# Patient Record
Sex: Female | Born: 1955 | Race: White | Hispanic: No | Marital: Single | Smoking: Current every day smoker
Health system: Southern US, Community
[De-identification: ages and names within clinical notes are randomized; demographics above are authoritative.]

## PROBLEM LIST (undated history)

## (undated) DIAGNOSIS — F410 Panic disorder [episodic paroxysmal anxiety] without agoraphobia: Secondary | ICD-10-CM

## (undated) DIAGNOSIS — I10 Essential (primary) hypertension: Secondary | ICD-10-CM

## (undated) DIAGNOSIS — E7800 Pure hypercholesterolemia, unspecified: Secondary | ICD-10-CM

## (undated) HISTORY — DX: Panic disorder (episodic paroxysmal anxiety): F41.0

## (undated) HISTORY — PX: TUBAL LIGATION: SHX77

## (undated) HISTORY — PX: BREAST ENHANCEMENT SURGERY: SHX7

## (undated) HISTORY — DX: Pure hypercholesterolemia, unspecified: E78.00

## (undated) HISTORY — DX: Essential (primary) hypertension: I10

---

## 2000-10-02 ENCOUNTER — Other Ambulatory Visit: Admission: RE | Admit: 2000-10-02 | Discharge: 2000-10-02 | Payer: Self-pay | Admitting: Obstetrics and Gynecology

## 2001-11-12 ENCOUNTER — Other Ambulatory Visit: Admission: RE | Admit: 2001-11-12 | Discharge: 2001-11-12 | Payer: Self-pay | Admitting: Obstetrics and Gynecology

## 2002-11-02 ENCOUNTER — Encounter: Admission: RE | Admit: 2002-11-02 | Discharge: 2002-11-02 | Payer: Self-pay | Admitting: Family Medicine

## 2002-11-02 ENCOUNTER — Encounter (INDEPENDENT_AMBULATORY_CARE_PROVIDER_SITE_OTHER): Payer: Self-pay | Admitting: *Deleted

## 2002-11-18 ENCOUNTER — Other Ambulatory Visit: Admission: RE | Admit: 2002-11-18 | Discharge: 2002-11-18 | Payer: Self-pay | Admitting: Family Medicine

## 2002-11-18 ENCOUNTER — Encounter: Admission: RE | Admit: 2002-11-18 | Discharge: 2002-11-18 | Payer: Self-pay | Admitting: Family Medicine

## 2002-11-23 ENCOUNTER — Encounter: Admission: RE | Admit: 2002-11-23 | Discharge: 2002-11-23 | Payer: Self-pay | Admitting: Family Medicine

## 2002-12-08 ENCOUNTER — Encounter: Admission: RE | Admit: 2002-12-08 | Discharge: 2002-12-08 | Payer: Self-pay | Admitting: Sports Medicine

## 2003-05-14 ENCOUNTER — Encounter: Admission: RE | Admit: 2003-05-14 | Discharge: 2003-05-14 | Payer: Self-pay | Admitting: Family Medicine

## 2003-05-14 ENCOUNTER — Encounter (INDEPENDENT_AMBULATORY_CARE_PROVIDER_SITE_OTHER): Payer: Self-pay | Admitting: *Deleted

## 2003-06-11 ENCOUNTER — Encounter: Admission: RE | Admit: 2003-06-11 | Discharge: 2003-06-11 | Payer: Self-pay | Admitting: Family Medicine

## 2003-08-16 ENCOUNTER — Encounter (INDEPENDENT_AMBULATORY_CARE_PROVIDER_SITE_OTHER): Payer: Self-pay | Admitting: *Deleted

## 2003-08-16 LAB — CONVERTED CEMR LAB

## 2003-09-14 ENCOUNTER — Encounter: Admission: RE | Admit: 2003-09-14 | Discharge: 2003-09-14 | Payer: Self-pay | Admitting: Family Medicine

## 2003-09-14 ENCOUNTER — Encounter (INDEPENDENT_AMBULATORY_CARE_PROVIDER_SITE_OTHER): Payer: Self-pay | Admitting: *Deleted

## 2003-09-29 ENCOUNTER — Ambulatory Visit: Payer: Self-pay | Admitting: Family Medicine

## 2003-12-21 ENCOUNTER — Ambulatory Visit: Payer: Self-pay | Admitting: Sports Medicine

## 2004-03-22 ENCOUNTER — Ambulatory Visit: Payer: Self-pay | Admitting: Family Medicine

## 2005-03-12 ENCOUNTER — Ambulatory Visit: Payer: Self-pay | Admitting: Family Medicine

## 2006-03-14 DIAGNOSIS — I10 Essential (primary) hypertension: Secondary | ICD-10-CM | POA: Insufficient documentation

## 2006-03-14 DIAGNOSIS — M542 Cervicalgia: Secondary | ICD-10-CM | POA: Insufficient documentation

## 2006-03-14 DIAGNOSIS — F17200 Nicotine dependence, unspecified, uncomplicated: Secondary | ICD-10-CM | POA: Insufficient documentation

## 2006-03-14 DIAGNOSIS — E7800 Pure hypercholesterolemia, unspecified: Secondary | ICD-10-CM | POA: Insufficient documentation

## 2006-03-14 DIAGNOSIS — F410 Panic disorder [episodic paroxysmal anxiety] without agoraphobia: Secondary | ICD-10-CM | POA: Insufficient documentation

## 2006-03-15 ENCOUNTER — Encounter (INDEPENDENT_AMBULATORY_CARE_PROVIDER_SITE_OTHER): Payer: Self-pay | Admitting: *Deleted

## 2006-05-29 ENCOUNTER — Ambulatory Visit: Payer: Self-pay | Admitting: Internal Medicine

## 2006-05-29 LAB — CONVERTED CEMR LAB
BUN: 16 mg/dL (ref 6–23)
Basophils Absolute: 0 10*3/uL (ref 0.0–0.1)
Basophils Relative: 0 % (ref 0.0–1.0)
CO2: 30 meq/L (ref 19–32)
Calcium: 9.5 mg/dL (ref 8.4–10.5)
Chloride: 105 meq/L (ref 96–112)
Creatinine, Ser: 0.8 mg/dL (ref 0.4–1.2)
Eosinophils Absolute: 0.1 10*3/uL (ref 0.0–0.6)
Eosinophils Relative: 1.9 % (ref 0.0–5.0)
GFR calc Af Amer: 98 mL/min
GFR calc non Af Amer: 81 mL/min
Glucose, Bld: 94 mg/dL (ref 70–99)
HCT: 40.8 % (ref 36.0–46.0)
Hemoglobin: 14 g/dL (ref 12.0–15.0)
Lymphocytes Relative: 39.3 % (ref 12.0–46.0)
MCHC: 34.4 g/dL (ref 30.0–36.0)
MCV: 95 fL (ref 78.0–100.0)
Monocytes Absolute: 0.4 10*3/uL (ref 0.2–0.7)
Monocytes Relative: 6.3 % (ref 3.0–11.0)
Neutro Abs: 3.8 10*3/uL (ref 1.4–7.7)
Neutrophils Relative %: 52.5 % (ref 43.0–77.0)
Platelets: 273 10*3/uL (ref 150–400)
Potassium: 4.2 meq/L (ref 3.5–5.1)
RBC: 4.29 M/uL (ref 3.87–5.11)
RDW: 13.7 % (ref 11.5–14.6)
Sed Rate: 11 mm/hr (ref 0–25)
Sodium: 140 meq/L (ref 135–145)
TSH: 1.52 microintl units/mL (ref 0.35–5.50)
WBC: 7.1 10*3/uL (ref 4.5–10.5)

## 2006-06-07 ENCOUNTER — Telehealth: Payer: Self-pay | Admitting: Internal Medicine

## 2006-06-11 ENCOUNTER — Encounter: Payer: Self-pay | Admitting: Internal Medicine

## 2006-06-11 ENCOUNTER — Ambulatory Visit: Payer: Self-pay | Admitting: Internal Medicine

## 2006-06-11 DIAGNOSIS — R51 Headache: Secondary | ICD-10-CM | POA: Insufficient documentation

## 2006-06-11 DIAGNOSIS — R079 Chest pain, unspecified: Secondary | ICD-10-CM | POA: Insufficient documentation

## 2006-06-11 DIAGNOSIS — R519 Headache, unspecified: Secondary | ICD-10-CM | POA: Insufficient documentation

## 2006-10-15 ENCOUNTER — Encounter: Admission: RE | Admit: 2006-10-15 | Discharge: 2006-10-15 | Payer: Self-pay | Admitting: Obstetrics and Gynecology

## 2008-03-08 ENCOUNTER — Ambulatory Visit: Payer: Self-pay | Admitting: Diagnostic Radiology

## 2008-03-08 ENCOUNTER — Emergency Department (HOSPITAL_BASED_OUTPATIENT_CLINIC_OR_DEPARTMENT_OTHER): Admission: EM | Admit: 2008-03-08 | Discharge: 2008-03-08 | Payer: Self-pay | Admitting: Emergency Medicine

## 2010-02-05 ENCOUNTER — Encounter: Payer: Self-pay | Admitting: Obstetrics and Gynecology

## 2010-05-02 LAB — POCT CARDIAC MARKERS
CKMB, poc: 1.3 ng/mL (ref 1.0–8.0)
CKMB, poc: 1.4 ng/mL (ref 1.0–8.0)
Myoglobin, poc: 45.4 ng/mL (ref 12–200)
Myoglobin, poc: 78.8 ng/mL (ref 12–200)
Troponin i, poc: <0.05 ng/mL (ref 0.00–0.09)
Troponin i, poc: <0.05 ng/mL (ref 0.00–0.09)

## 2010-05-02 LAB — DIFFERENTIAL
Basophils Absolute: 0.1 10*3/uL (ref 0.0–0.1)
Basophils Relative: 1 % (ref 0–1)
Eosinophils Absolute: 0.2 10*3/uL (ref 0.0–0.7)
Eosinophils Relative: 2 % (ref 0–5)
Lymphocytes Relative: 43 % (ref 12–46)
Lymphs Abs: 4.1 10*3/uL — ABNORMAL HIGH (ref 0.7–4.0)
Monocytes Absolute: 0.6 10*3/uL (ref 0.1–1.0)
Monocytes Relative: 7 % (ref 3–12)
Neutro Abs: 4.5 10*3/uL (ref 1.7–7.7)
Neutrophils Relative %: 48 % (ref 43–77)

## 2010-05-02 LAB — BASIC METABOLIC PANEL
BUN: 12 mg/dL (ref 6–23)
CO2: 28 mEq/L (ref 19–32)
Calcium: 9.6 mg/dL (ref 8.4–10.5)
Chloride: 106 mEq/L (ref 96–112)
Creatinine, Ser: 0.7 mg/dL (ref 0.4–1.2)
GFR calc Af Amer: >60 mL/min (ref >60)
GFR calc non Af Amer: >60 mL/min (ref >60)
Glucose, Bld: 84 mg/dL (ref 70–99)
Potassium: 3.5 mEq/L (ref 3.5–5.1)
Sodium: 142 mEq/L (ref 135–145)

## 2010-05-02 LAB — CBC
HCT: 43.1 % (ref 36.0–46.0)
Hemoglobin: 14.4 g/dL (ref 12.0–15.0)
MCHC: 33.4 g/dL (ref 30.0–36.0)
MCV: 91.7 fL (ref 78.0–100.0)
Platelets: 334 10*3/uL (ref 150–400)
RBC: 4.7 MIL/uL (ref 3.87–5.11)
RDW: 13.3 % (ref 11.5–15.5)
WBC: 9.5 10*3/uL (ref 4.0–10.5)

## 2010-12-02 IMAGING — CR DG CHEST 2V
2 series · 2 of 2 positions shown · non-contrast
Comparison: None

CLINICAL DATA: Chest pain.  Hypertension.  Smoking history.

CHEST - 2 VIEW

[w chest pa]
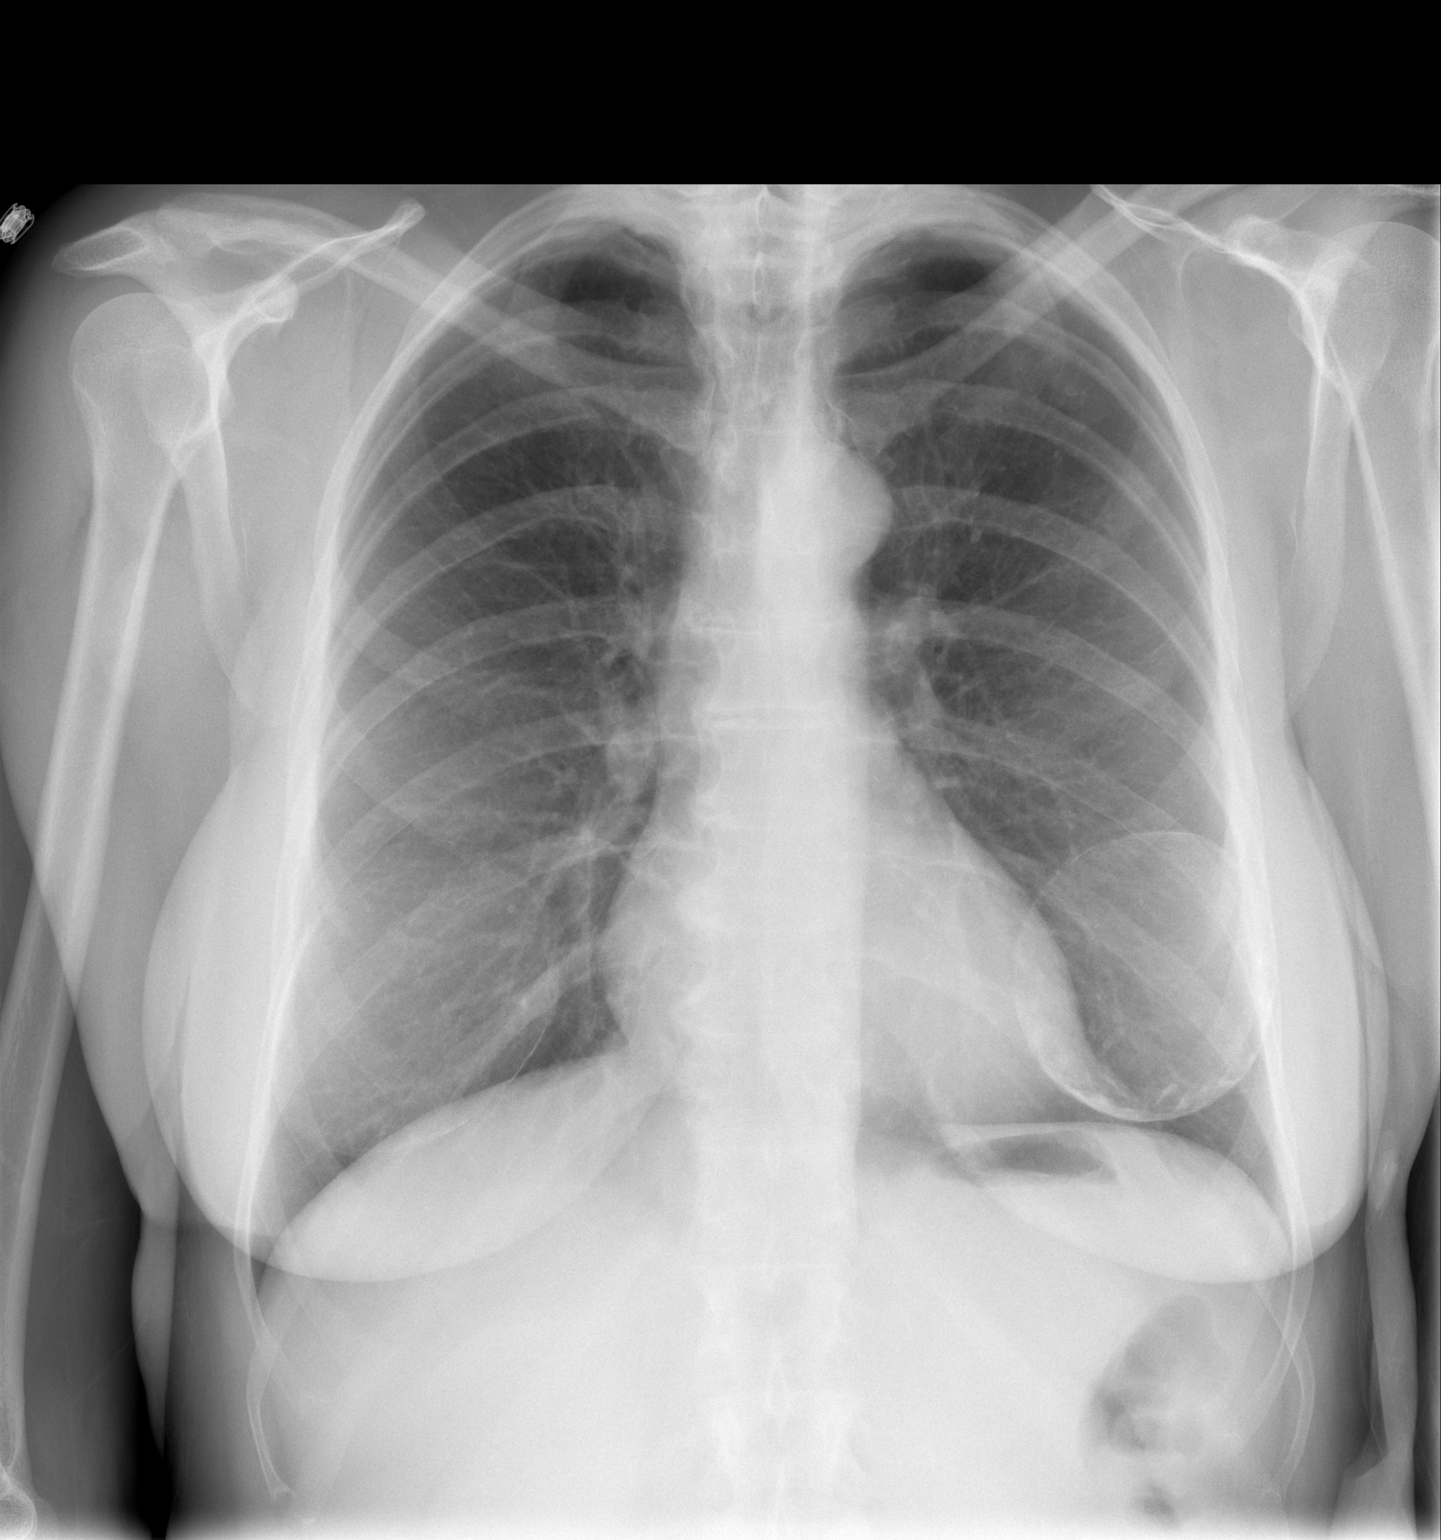

[w chest lat]
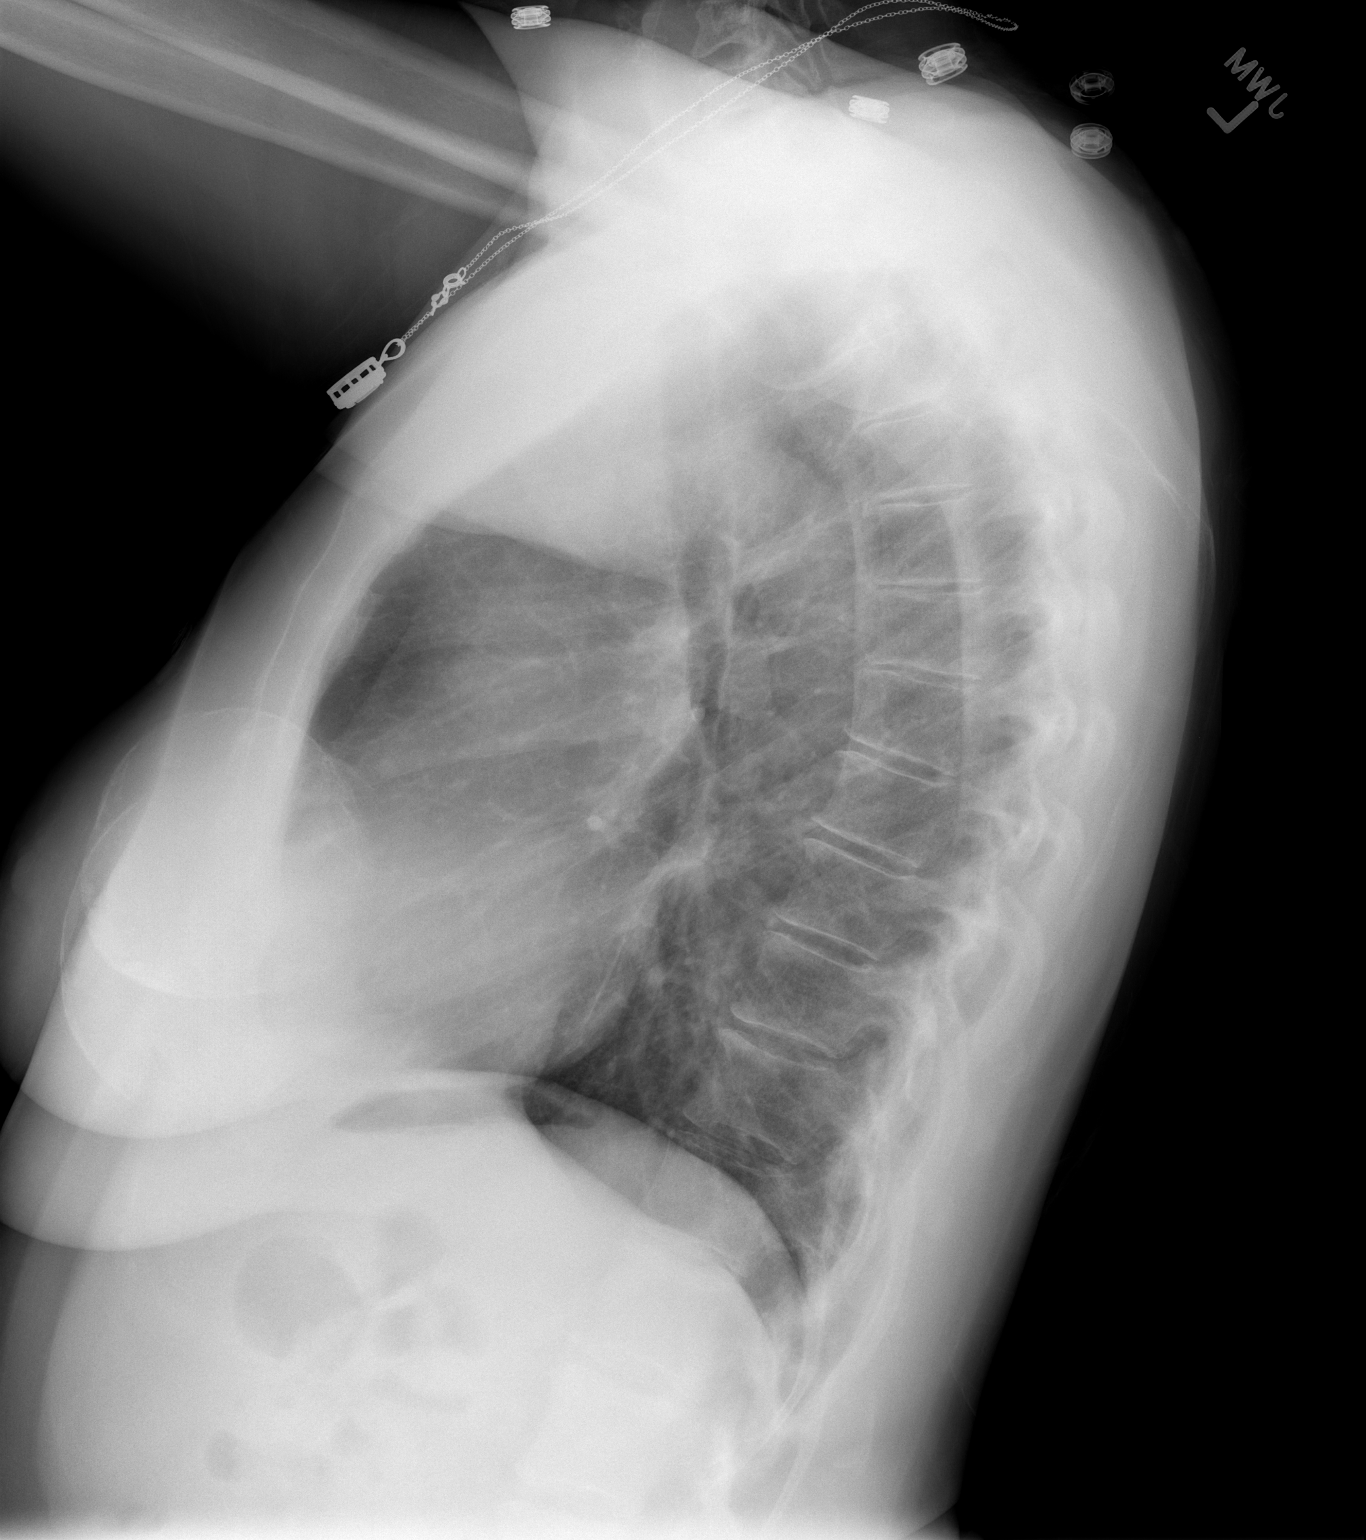

[2 of 2 positions shown; findings below may reference images not displayed]

FINDINGS: Heart size is normal.  The mediastinum is unremarkable.
Lungs are clear.  Vascularity is normal.  No effusions.  No
significant bony finding.  Breast implants present bilaterally.
IMPRESSION: No active cardiopulmonary disease.

## 2011-05-29 ENCOUNTER — Encounter: Payer: Self-pay | Admitting: *Deleted

## 2011-10-02 ENCOUNTER — Encounter: Payer: Self-pay | Admitting: Cardiology

## 2011-11-12 ENCOUNTER — Other Ambulatory Visit: Payer: Self-pay | Admitting: Obstetrics and Gynecology

## 2014-11-16 ENCOUNTER — Other Ambulatory Visit: Payer: Self-pay | Admitting: Obstetrics and Gynecology

## 2014-11-17 LAB — CYTOLOGY - PAP

## 2015-12-15 ENCOUNTER — Other Ambulatory Visit: Payer: Self-pay | Admitting: Obstetrics and Gynecology

## 2015-12-16 LAB — CYTOLOGY - PAP

## 2016-04-03 DIAGNOSIS — R7301 Impaired fasting glucose: Secondary | ICD-10-CM | POA: Diagnosis not present

## 2016-04-03 DIAGNOSIS — R002 Palpitations: Secondary | ICD-10-CM | POA: Diagnosis not present

## 2016-04-03 DIAGNOSIS — E785 Hyperlipidemia, unspecified: Secondary | ICD-10-CM | POA: Diagnosis not present

## 2016-04-03 DIAGNOSIS — I10 Essential (primary) hypertension: Secondary | ICD-10-CM | POA: Diagnosis not present

## 2016-06-15 DIAGNOSIS — H6123 Impacted cerumen, bilateral: Secondary | ICD-10-CM | POA: Diagnosis not present

## 2017-01-24 DIAGNOSIS — I10 Essential (primary) hypertension: Secondary | ICD-10-CM | POA: Diagnosis not present

## 2017-01-24 DIAGNOSIS — R7301 Impaired fasting glucose: Secondary | ICD-10-CM | POA: Diagnosis not present

## 2017-01-24 DIAGNOSIS — R002 Palpitations: Secondary | ICD-10-CM | POA: Diagnosis not present

## 2017-03-22 DIAGNOSIS — R0789 Other chest pain: Secondary | ICD-10-CM | POA: Diagnosis not present

## 2017-03-22 DIAGNOSIS — I10 Essential (primary) hypertension: Secondary | ICD-10-CM | POA: Diagnosis not present

## 2017-04-11 DIAGNOSIS — E7800 Pure hypercholesterolemia, unspecified: Secondary | ICD-10-CM | POA: Diagnosis not present

## 2017-04-11 DIAGNOSIS — R42 Dizziness and giddiness: Secondary | ICD-10-CM | POA: Diagnosis not present

## 2017-04-11 DIAGNOSIS — J069 Acute upper respiratory infection, unspecified: Secondary | ICD-10-CM | POA: Diagnosis not present

## 2017-04-11 DIAGNOSIS — R7301 Impaired fasting glucose: Secondary | ICD-10-CM | POA: Diagnosis not present

## 2017-04-15 DIAGNOSIS — J0100 Acute maxillary sinusitis, unspecified: Secondary | ICD-10-CM | POA: Diagnosis not present

## 2017-08-13 DIAGNOSIS — I10 Essential (primary) hypertension: Secondary | ICD-10-CM | POA: Diagnosis not present

## 2017-08-13 DIAGNOSIS — R7301 Impaired fasting glucose: Secondary | ICD-10-CM | POA: Diagnosis not present

## 2017-08-13 DIAGNOSIS — E7800 Pure hypercholesterolemia, unspecified: Secondary | ICD-10-CM | POA: Diagnosis not present

## 2017-08-15 DIAGNOSIS — E7800 Pure hypercholesterolemia, unspecified: Secondary | ICD-10-CM | POA: Diagnosis not present

## 2018-02-25 DIAGNOSIS — E7800 Pure hypercholesterolemia, unspecified: Secondary | ICD-10-CM | POA: Diagnosis not present

## 2018-02-25 DIAGNOSIS — I10 Essential (primary) hypertension: Secondary | ICD-10-CM | POA: Diagnosis not present

## 2018-02-25 DIAGNOSIS — R7303 Prediabetes: Secondary | ICD-10-CM | POA: Diagnosis not present

## 2018-12-19 ENCOUNTER — Other Ambulatory Visit: Payer: Self-pay | Admitting: Family Medicine

## 2018-12-19 DIAGNOSIS — Z1231 Encounter for screening mammogram for malignant neoplasm of breast: Secondary | ICD-10-CM

## 2019-04-11 ENCOUNTER — Ambulatory Visit: Payer: Self-pay | Attending: Internal Medicine

## 2019-04-11 DIAGNOSIS — Z23 Encounter for immunization: Secondary | ICD-10-CM

## 2019-04-11 NOTE — Progress Notes (Signed)
   Covid-[facility] Vaccination [facility]  Name:  Donna Compton    MRN: [id number] DOB: 04/16/1955  04/11/2019  Donna Compton was observed post Covid-19 immunization for 15 minutes without incident. She was provided with Vaccine Information Sheet and instruction to access the V-Safe system.   Donna Compton was instructed to call 911 with any severe reactions post vaccine: Marland Kitchen Difficulty breathing  . Swelling of face and throat  . A fast heartbeat  . A bad rash all over body  . Dizziness and weakness   Immunizations Administered    Name Date Dose VIS Date Route   Pfizer COVID-19 Vaccine 04/11/2019  9:22 AM 0.3 mL 12/26/2018 Intramuscular   Manufacturer: [employer], [employer]   Lot: [id number]   NDC: [address]-1

## 2019-05-06 ENCOUNTER — Ambulatory Visit: Payer: Self-pay | Attending: Internal Medicine

## 2019-05-06 DIAGNOSIS — Z23 Encounter for immunization: Secondary | ICD-10-CM

## 2019-05-06 NOTE — Progress Notes (Signed)
   Covid-19 Vaccination Clinic  Name:  Donna Compton    MRN: [id number] DOB: 1955/02/23  05/06/2019  Donna Compton was observed post Covid-19 immunization for 15 minutes without incident. She was provided with Vaccine Information Sheet and instruction to access the V-Safe system.   Donna Compton was instructed to call 911 with any severe reactions post vaccine: Marland Kitchen Difficulty breathing  . Swelling of face and throat  . A fast heartbeat  . A bad rash all over body  . Dizziness and weakness   Immunizations Administered    Name Date Dose VIS Date Route   Pfizer COVID-19 Vaccine 05/06/2019  4:22 PM 0.3 mL 03/11/2018 Intramuscular   Manufacturer: [employer], [employer]   Lot: [id number]   NDC: [address]-1

## 2021-10-03 DIAGNOSIS — F419 Anxiety disorder, unspecified: Secondary | ICD-10-CM | POA: Diagnosis not present

## 2021-10-03 DIAGNOSIS — G479 Sleep disorder, unspecified: Secondary | ICD-10-CM | POA: Diagnosis not present

## 2021-10-03 DIAGNOSIS — R7303 Prediabetes: Secondary | ICD-10-CM | POA: Diagnosis not present

## 2021-10-03 DIAGNOSIS — I10 Essential (primary) hypertension: Secondary | ICD-10-CM | POA: Diagnosis not present

## 2021-10-20 DIAGNOSIS — Z1211 Encounter for screening for malignant neoplasm of colon: Secondary | ICD-10-CM | POA: Diagnosis not present

## 2021-10-20 DIAGNOSIS — Z0000 Encounter for general adult medical examination without abnormal findings: Secondary | ICD-10-CM | POA: Diagnosis not present

## 2021-10-20 DIAGNOSIS — E7800 Pure hypercholesterolemia, unspecified: Secondary | ICD-10-CM | POA: Diagnosis not present

## 2021-10-20 DIAGNOSIS — E559 Vitamin D deficiency, unspecified: Secondary | ICD-10-CM | POA: Diagnosis not present

## 2021-10-20 DIAGNOSIS — R7303 Prediabetes: Secondary | ICD-10-CM | POA: Diagnosis not present

## 2021-10-20 DIAGNOSIS — I10 Essential (primary) hypertension: Secondary | ICD-10-CM | POA: Diagnosis not present

## 2022-04-26 DIAGNOSIS — I10 Essential (primary) hypertension: Secondary | ICD-10-CM | POA: Diagnosis not present

## 2022-04-26 DIAGNOSIS — R7303 Prediabetes: Secondary | ICD-10-CM | POA: Diagnosis not present

## 2022-04-26 DIAGNOSIS — E7800 Pure hypercholesterolemia, unspecified: Secondary | ICD-10-CM | POA: Diagnosis not present

## 2022-04-26 DIAGNOSIS — K30 Functional dyspepsia: Secondary | ICD-10-CM | POA: Diagnosis not present

## 2022-04-30 DIAGNOSIS — Z129 Encounter for screening for malignant neoplasm, site unspecified: Secondary | ICD-10-CM | POA: Diagnosis not present

## 2022-04-30 DIAGNOSIS — L308 Other specified dermatitis: Secondary | ICD-10-CM | POA: Diagnosis not present

## 2022-04-30 DIAGNOSIS — D485 Neoplasm of uncertain behavior of skin: Secondary | ICD-10-CM | POA: Diagnosis not present

## 2022-04-30 DIAGNOSIS — D0461 Carcinoma in situ of skin of right upper limb, including shoulder: Secondary | ICD-10-CM | POA: Diagnosis not present

## 2022-04-30 DIAGNOSIS — L821 Other seborrheic keratosis: Secondary | ICD-10-CM | POA: Diagnosis not present

## 2022-04-30 DIAGNOSIS — L820 Inflamed seborrheic keratosis: Secondary | ICD-10-CM | POA: Diagnosis not present

## 2022-04-30 DIAGNOSIS — L570 Actinic keratosis: Secondary | ICD-10-CM | POA: Diagnosis not present

## 2022-04-30 DIAGNOSIS — D225 Melanocytic nevi of trunk: Secondary | ICD-10-CM | POA: Diagnosis not present

## 2022-06-05 DIAGNOSIS — D0461 Carcinoma in situ of skin of right upper limb, including shoulder: Secondary | ICD-10-CM | POA: Diagnosis not present

## 2022-06-05 DIAGNOSIS — L570 Actinic keratosis: Secondary | ICD-10-CM | POA: Diagnosis not present

## 2022-08-06 DIAGNOSIS — L820 Inflamed seborrheic keratosis: Secondary | ICD-10-CM | POA: Diagnosis not present

## 2022-08-06 DIAGNOSIS — L570 Actinic keratosis: Secondary | ICD-10-CM | POA: Diagnosis not present

## 2022-08-06 DIAGNOSIS — L821 Other seborrheic keratosis: Secondary | ICD-10-CM | POA: Diagnosis not present

## 2022-08-17 DIAGNOSIS — H5203 Hypermetropia, bilateral: Secondary | ICD-10-CM | POA: Diagnosis not present

## 2022-10-08 DIAGNOSIS — L570 Actinic keratosis: Secondary | ICD-10-CM | POA: Diagnosis not present

## 2022-10-08 DIAGNOSIS — L820 Inflamed seborrheic keratosis: Secondary | ICD-10-CM | POA: Diagnosis not present

## 2022-10-08 DIAGNOSIS — D485 Neoplasm of uncertain behavior of skin: Secondary | ICD-10-CM | POA: Diagnosis not present

## 2022-10-08 DIAGNOSIS — Z86008 Personal history of in-situ neoplasm of other site: Secondary | ICD-10-CM | POA: Diagnosis not present

## 2022-10-08 DIAGNOSIS — L821 Other seborrheic keratosis: Secondary | ICD-10-CM | POA: Diagnosis not present

## 2023-03-27 DIAGNOSIS — Z0000 Encounter for general adult medical examination without abnormal findings: Secondary | ICD-10-CM | POA: Diagnosis not present

## 2023-03-27 DIAGNOSIS — E7800 Pure hypercholesterolemia, unspecified: Secondary | ICD-10-CM | POA: Diagnosis not present

## 2023-03-27 DIAGNOSIS — I10 Essential (primary) hypertension: Secondary | ICD-10-CM | POA: Diagnosis not present

## 2023-03-27 DIAGNOSIS — R7303 Prediabetes: Secondary | ICD-10-CM | POA: Diagnosis not present

## 2023-03-27 DIAGNOSIS — E559 Vitamin D deficiency, unspecified: Secondary | ICD-10-CM | POA: Diagnosis not present

## 2023-03-27 DIAGNOSIS — K30 Functional dyspepsia: Secondary | ICD-10-CM | POA: Diagnosis not present

## 2023-09-18 DIAGNOSIS — K080 Exfoliation of teeth due to systemic causes: Secondary | ICD-10-CM | POA: Diagnosis not present

## 2023-11-15 DIAGNOSIS — I10 Essential (primary) hypertension: Secondary | ICD-10-CM | POA: Diagnosis not present

## 2023-11-15 DIAGNOSIS — R7303 Prediabetes: Secondary | ICD-10-CM | POA: Diagnosis not present

## 2023-11-15 DIAGNOSIS — E7800 Pure hypercholesterolemia, unspecified: Secondary | ICD-10-CM | POA: Diagnosis not present

## 2023-11-15 DIAGNOSIS — E559 Vitamin D deficiency, unspecified: Secondary | ICD-10-CM | POA: Diagnosis not present
